# Patient Record
Sex: Male | Born: 1985 | Race: White | Hispanic: No | Marital: Married | Smoking: Never smoker
Health system: Southern US, Community
[De-identification: ages and names within clinical notes are randomized; demographics above are authoritative.]

---

## 2008-07-09 ENCOUNTER — Emergency Department (HOSPITAL_COMMUNITY): Admission: EM | Admit: 2008-07-09 | Discharge: 2008-07-09 | Payer: Self-pay | Admitting: Emergency Medicine
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
## 2017-08-16 ENCOUNTER — Other Ambulatory Visit: Payer: Self-pay

## 2017-08-16 ENCOUNTER — Emergency Department: Payer: Worker's Compensation

## 2017-08-16 ENCOUNTER — Emergency Department
Admission: EM | Admit: 2017-08-16 | Discharge: 2017-08-16 | Disposition: A | Discharge status: Home / Self Care | Payer: Worker's Compensation | Attending: Emergency Medicine | Admitting: Emergency Medicine

## 2017-08-16 ENCOUNTER — Encounter: Payer: Self-pay | Admitting: Emergency Medicine

## 2017-08-16 DIAGNOSIS — Y92410 Unspecified street and highway as the place of occurrence of the external cause: Secondary | ICD-10-CM | POA: Diagnosis not present

## 2017-08-16 DIAGNOSIS — S161XXA Strain of muscle, fascia and tendon at neck level, initial encounter: Secondary | ICD-10-CM | POA: Insufficient documentation

## 2017-08-16 DIAGNOSIS — S39012A Strain of muscle, fascia and tendon of lower back, initial encounter: Secondary | ICD-10-CM

## 2017-08-16 DIAGNOSIS — Y9389 Activity, other specified: Secondary | ICD-10-CM | POA: Insufficient documentation

## 2017-08-16 DIAGNOSIS — S0083XA Contusion of other part of head, initial encounter: Secondary | ICD-10-CM | POA: Diagnosis not present

## 2017-08-16 DIAGNOSIS — Y999 Unspecified external cause status: Secondary | ICD-10-CM | POA: Diagnosis not present

## 2017-08-16 DIAGNOSIS — S3992XA Unspecified injury of lower back, initial encounter: Secondary | ICD-10-CM | POA: Insufficient documentation

## 2017-08-16 DIAGNOSIS — M7918 Myalgia, other site: Secondary | ICD-10-CM

## 2017-08-16 MED ORDER — IBUPROFEN 800 MG PO TABS: 800.0000 mg | ORAL_TABLET | Freq: Three times a day (TID) | ORAL | 0 refills | Status: AC | PRN

## 2017-08-16 MED ORDER — TRAMADOL HCL 50 MG PO TABS
50.0000 mg | ORAL_TABLET | Freq: Four times a day (QID) | ORAL | 0 refills | Status: AC | PRN
Start: 2017-08-16 — End: 2018-08-16

## 2017-08-16 MED ORDER — TRAMADOL HCL 50 MG PO TABS
50.0000 mg | ORAL_TABLET | Freq: Once | ORAL | Status: AC
  Administered 2017-08-16: 50 mg via ORAL
  Filled 2017-08-16: qty 1

## 2017-08-16 MED ORDER — CYCLOBENZAPRINE HCL 10 MG PO TABS
10.0000 mg | ORAL_TABLET | Freq: Once | ORAL | Status: AC
  Administered 2017-08-16: 10 mg via ORAL
  Filled 2017-08-16: qty 1

## 2017-08-16 MED ORDER — CYCLOBENZAPRINE HCL 10 MG PO TABS: 10.0000 mg | ORAL_TABLET | Freq: Three times a day (TID) | ORAL | 0 refills | Status: AC | PRN
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
## 2019-07-28 IMAGING — CR DG LUMBAR SPINE 2-3V
1 series · 3 of 3 positions shown · non-contrast
Comparison: None

CLINICAL DATA: Pain

EXAM:
LUMBAR SPINE - 2-3 VIEW

[Series 1: dg lumbar spine 2-3 views · 0.14mm/px · 3 of 3 slices shown]
[im 1/3]
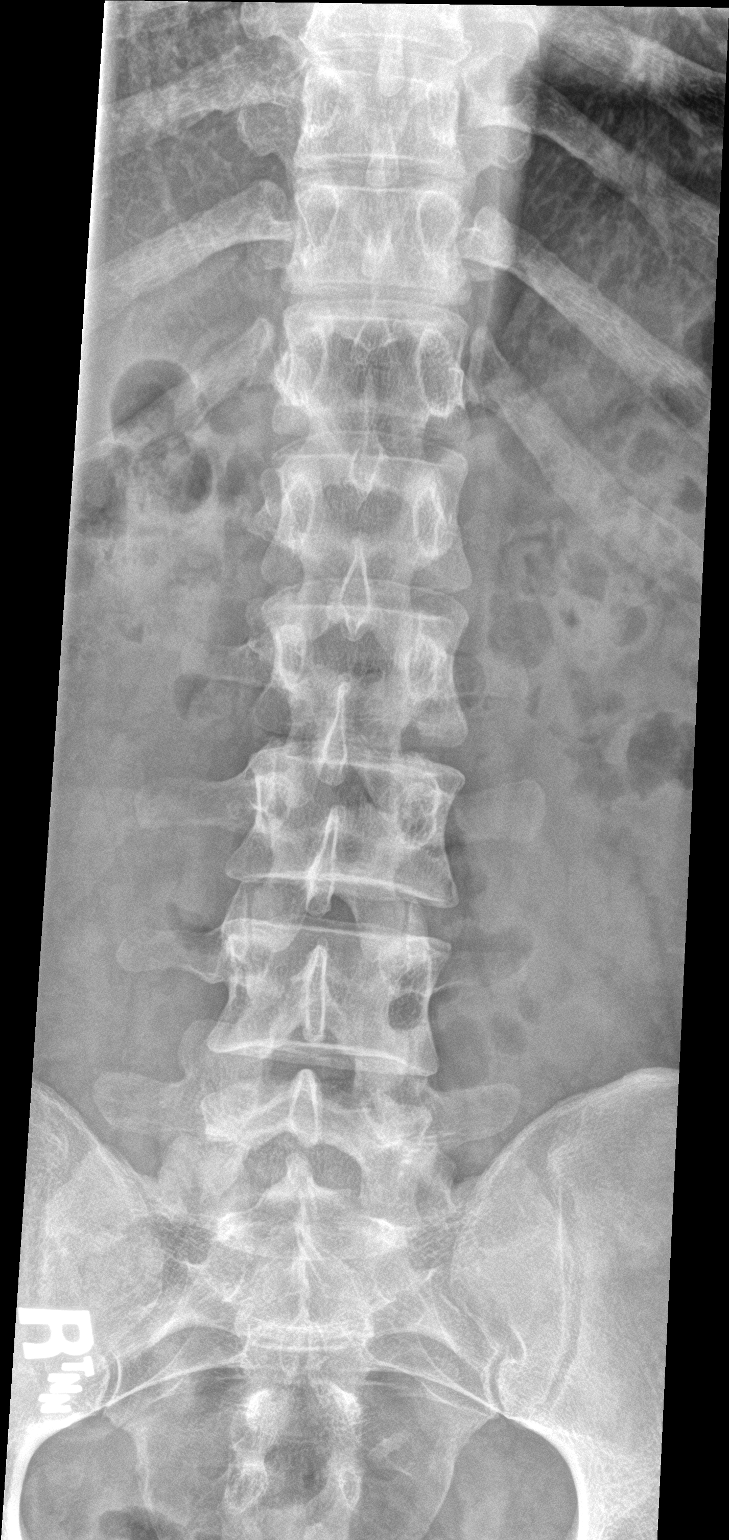
[im 2/3]
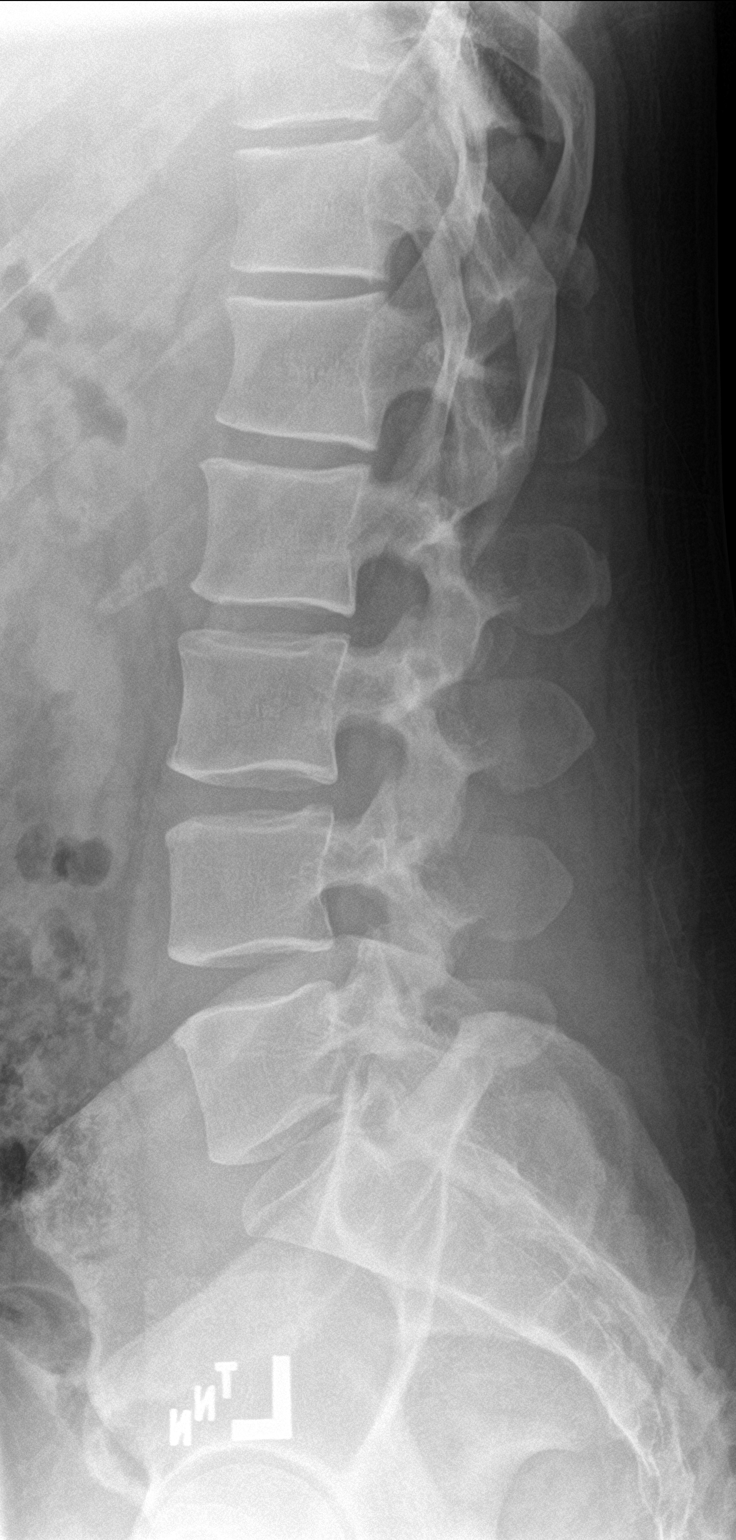
[im 3/3]
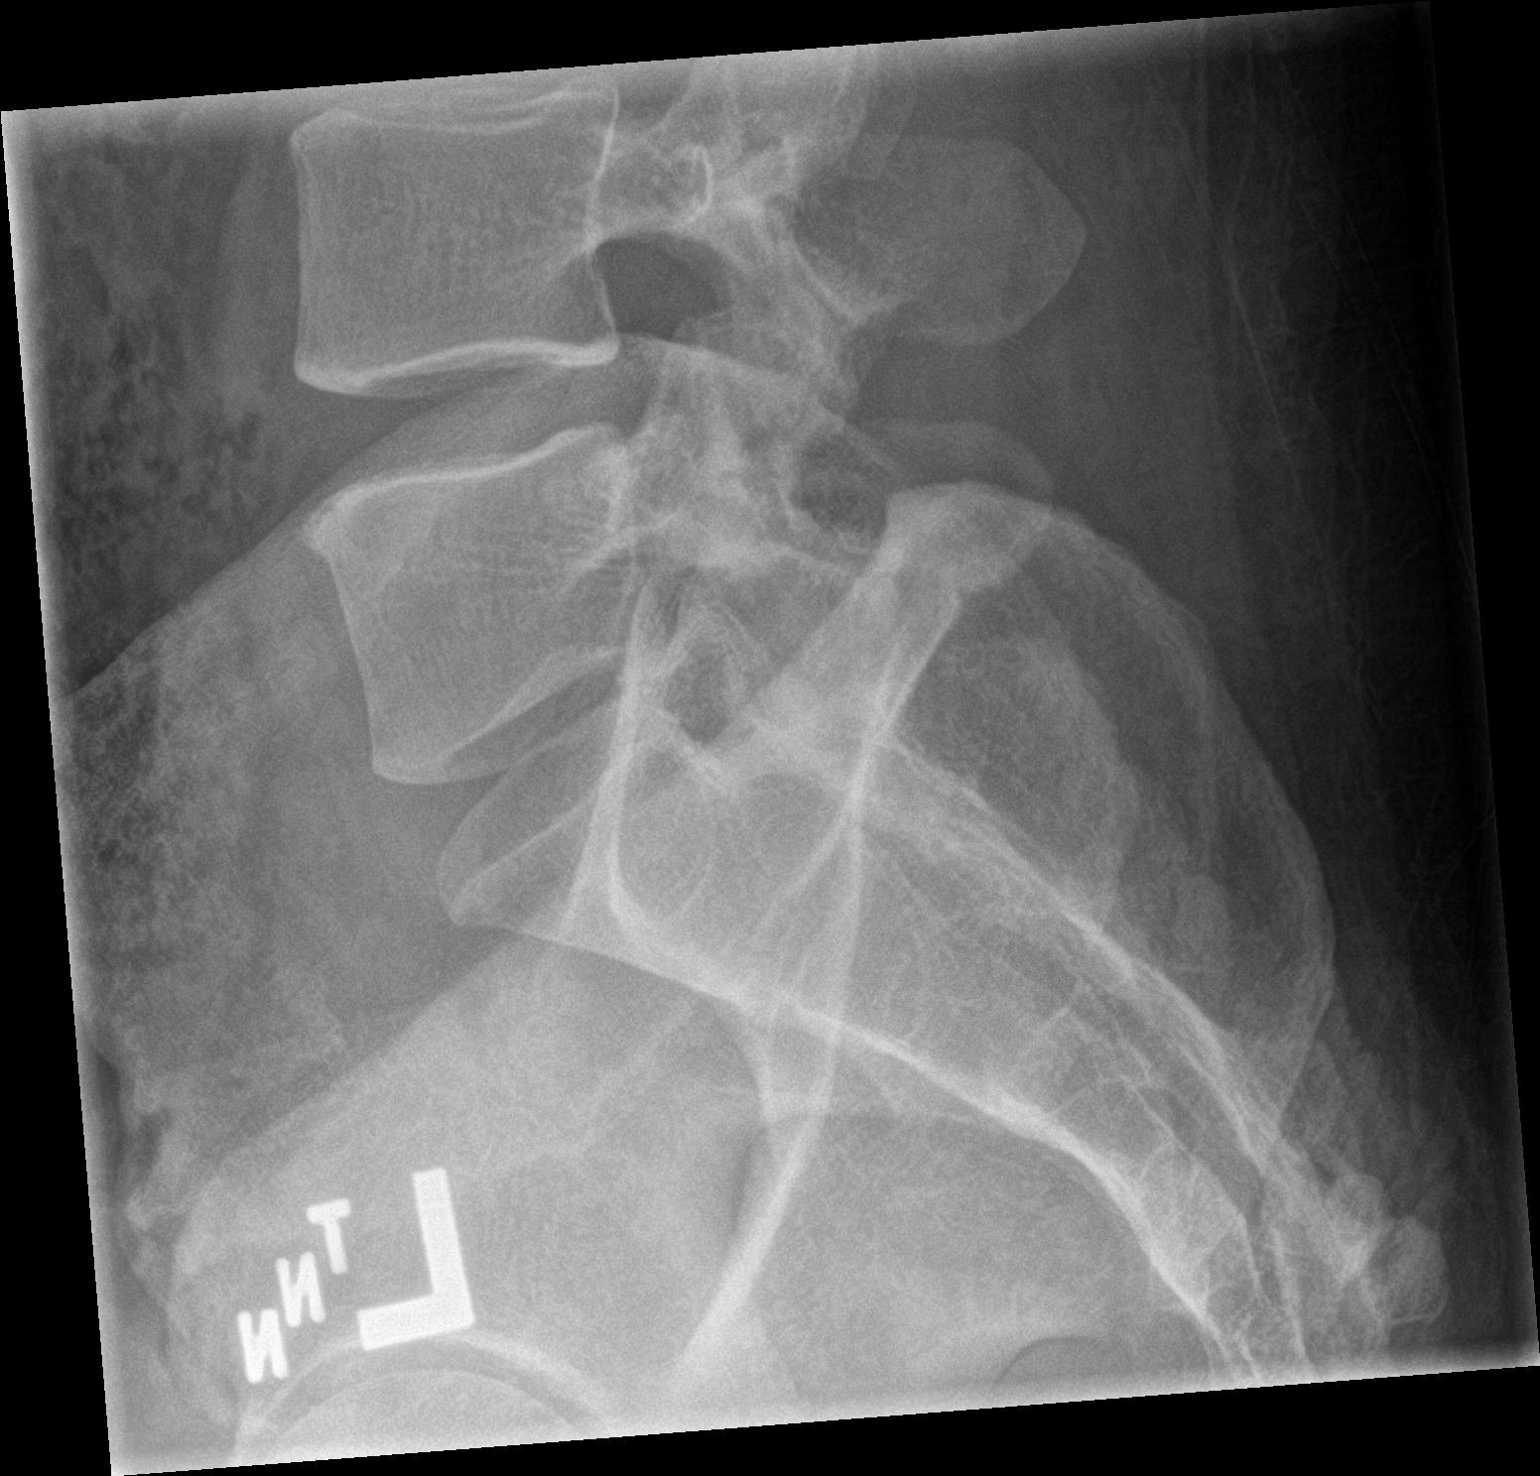

[3 of 3 positions shown; findings below may reference images not displayed]

FINDINGS: There is no evidence of lumbar spine fracture. Alignment is normal.
Intervertebral disc spaces are maintained.
IMPRESSION: Negative.
